# Patient Record
Sex: Female | Born: 1965
Health system: Southern US, Community
[De-identification: ages and names within clinical notes are randomized; demographics above are authoritative.]

## PROBLEM LIST (undated history)

## (undated) DIAGNOSIS — R3915 Urgency of urination: Secondary | ICD-10-CM

## (undated) DIAGNOSIS — R350 Frequency of micturition: Secondary | ICD-10-CM

## (undated) DIAGNOSIS — R3129 Other microscopic hematuria: Secondary | ICD-10-CM

## (undated) DIAGNOSIS — Z973 Presence of spectacles and contact lenses: Secondary | ICD-10-CM

## (undated) DIAGNOSIS — R3989 Other symptoms and signs involving the genitourinary system: Secondary | ICD-10-CM

## (undated) DIAGNOSIS — K5909 Other constipation: Secondary | ICD-10-CM

## (undated) DIAGNOSIS — R351 Nocturia: Secondary | ICD-10-CM

---

## 1991-11-24 HISTORY — PX: NASAL SEPTUM SURGERY: SHX37

## 2001-03-29 ENCOUNTER — Other Ambulatory Visit: Admission: RE | Admit: 2001-03-29 | Discharge: 2001-03-29 | Payer: Self-pay | Admitting: Gynecology

## 2002-06-21 ENCOUNTER — Encounter: Admission: RE | Admit: 2002-06-21 | Discharge: 2002-06-21 | Payer: Self-pay | Admitting: Gynecology

## 2002-06-21 ENCOUNTER — Encounter: Payer: Self-pay | Admitting: Gynecology

## 2002-06-22 ENCOUNTER — Other Ambulatory Visit: Admission: RE | Admit: 2002-06-22 | Discharge: 2002-06-22 | Payer: Self-pay | Admitting: Gynecology

## 2003-07-04 ENCOUNTER — Other Ambulatory Visit: Admission: RE | Admit: 2003-07-04 | Discharge: 2003-07-04 | Payer: Self-pay | Admitting: Gynecology

## 2003-08-12 ENCOUNTER — Emergency Department (HOSPITAL_COMMUNITY): Admission: EM | Admit: 2003-08-12 | Discharge: 2003-08-12 | Payer: Self-pay

## 2004-08-25 ENCOUNTER — Other Ambulatory Visit: Admission: RE | Admit: 2004-08-25 | Discharge: 2004-08-25 | Payer: Self-pay | Admitting: Gynecology

## 2005-09-02 ENCOUNTER — Other Ambulatory Visit: Admission: RE | Admit: 2005-09-02 | Discharge: 2005-09-02 | Payer: Self-pay | Admitting: Gynecology

## 2006-03-11 ENCOUNTER — Other Ambulatory Visit: Admission: RE | Admit: 2006-03-11 | Discharge: 2006-03-11 | Payer: Self-pay | Admitting: Gynecology

## 2006-09-28 ENCOUNTER — Other Ambulatory Visit: Admission: RE | Admit: 2006-09-28 | Discharge: 2006-09-28 | Payer: Self-pay | Admitting: Gynecology

## 2007-11-14 ENCOUNTER — Other Ambulatory Visit: Admission: RE | Admit: 2007-11-14 | Discharge: 2007-11-14 | Payer: Self-pay | Admitting: Gynecology

## 2007-12-07 ENCOUNTER — Encounter: Admission: RE | Admit: 2007-12-07 | Discharge: 2007-12-07 | Payer: Self-pay | Admitting: Gynecology

## 2007-12-12 ENCOUNTER — Encounter: Admission: RE | Admit: 2007-12-12 | Discharge: 2007-12-12 | Payer: Self-pay | Admitting: Gynecology

## 2010-12-14 ENCOUNTER — Encounter: Payer: Self-pay | Admitting: Gynecology

## 2013-05-12 ENCOUNTER — Other Ambulatory Visit: Payer: Self-pay

## 2013-05-12 DIAGNOSIS — Z1231 Encounter for screening mammogram for malignant neoplasm of breast: Secondary | ICD-10-CM

## 2013-06-01 ENCOUNTER — Ambulatory Visit
Admission: RE | Admit: 2013-06-01 | Discharge: 2013-06-01 | Disposition: A | Discharge status: Home / Self Care | Payer: BC Managed Care – PPO | Source: Ambulatory Visit

## 2013-06-01 DIAGNOSIS — Z1231 Encounter for screening mammogram for malignant neoplasm of breast: Secondary | ICD-10-CM

## 2015-03-24 HISTORY — PX: COLONOSCOPY WITH PROPOFOL: SHX5780

## 2015-08-27 ENCOUNTER — Other Ambulatory Visit: Payer: Self-pay | Admitting: Urology

## 2015-09-03 ENCOUNTER — Encounter (HOSPITAL_BASED_OUTPATIENT_CLINIC_OR_DEPARTMENT_OTHER): Payer: Self-pay | Admitting: *Deleted

## 2015-09-04 ENCOUNTER — Encounter (HOSPITAL_BASED_OUTPATIENT_CLINIC_OR_DEPARTMENT_OTHER): Payer: Self-pay | Admitting: *Deleted

## 2015-09-04 NOTE — Progress Notes (Signed)
NPO AFTER MN.  ARRIVE AT 0730.  NEEDS HG AND URINE PREG.  

## 2015-09-09 NOTE — H&P (Signed)
Active Problems Problems  1. Bladder pain (R39.89) 2. Increased urinary frequency (R35.0) 3. Urinary urgency (R39.15)  History of Present Illness Barbara BattenKim returns today in f/u from [facility]. She has a small capacity painful bladder with instability and UUI but she reports she has no incontinence but it is probably because she voids so often. She has had no gross hematuria but has some symptoms suggestive of a UTI today including a bad taste in her mouth that she gets when she has one.   Past Medical History Problems  1. History of renal calculi (Z87.442) 2. History of No acute medical problems  Surgical History Problems  1. History of Cesarean Section  Current Meds 1. Linzess CAPS;  Therapy: (Recorded:23May2016) to Recorded  Allergies Medication  1. No Known Drug Allergies  Family History Problems  1. No pertinent family history : Mother  Social History Problems  1. Alcohol use (Z78.9)   socially 2. Caffeine use (F15.90)   2 cups per day 3. Death in the family, father   age 51 Heart attack 4. Divorced 5. Never a smoker 6. Number of children   1 son 7. Occupation   Engineer, civil (consulting)eal estate Developer  Review of Systems  Genitourinary: urinary frequency, nocturia and suprapubic pain, but no incontinence.  Constitutional: (she feels feverish).  Musculoskeletal: back pain.    Vitals Vital Signs [Data Includes: Last 1 Day]  Recorded: 03Oct2016 03:13PM  Blood Pressure: 116 / 79 Temperature: 98.7 F Heart Rate: 87  Physical Exam Constitutional: Well nourished and well developed . No acute distress.  Pulmonary: No respiratory distress and normal respiratory rhythm and effort.  Cardiovascular: Heart rate and rhythm are normal . No peripheral edema.    Procedure  Procedure: Urodynamics  Test indication: Frequency, Urgency, Rare Incontinence.  The procedure's risks, benefits and infection risk were discussed with the patient.  Pre Uroflow & Catheterization  Procedure: Pre  Uroflow Study. Equipment And Procedure:  The patient did not void.  A(n) 14 French red rubber catheter was inserted.  After catheter placement, measurements were obtained the volume collected was 40 ml.  A urodynamic catheter was inserted. Prior to starting the study, she said she felt discomfort in her bladder and rating it a 2-3/10. She said she keeps a discomfort and soreness there most of the time.  Cystometry  Procedure: Cystometrogram.  The bladder was filled with room temperature water at a rate of less than 50 cc per minute.  After bladder filling, measurements obtained include the maximum cystometric capacity of 283ml.  Maximum capacity produced a feeling of bladder fullness and pain.  Bladder sensations. The first sensation of filling occurred at 227ml. The normal desire to void occurred at 248ml. The strong desire to void occurred at 272ml. The first contraction occurred at 283ml, the maximum unstable bladder contraction pressure was 33 cm H2O and severe urine leakage occurred. Bladder pain reached a 4/10 at max capacity. Injection of contrast was performed for the cystometrogram.  Leak Point Pressure  Procedure: Valsalva Leak Point Pressure.  The patient was placed in the sitting position.  A fluoroscopic method was used to determine leak point pressure.  The patient did not leak and the maximum abdominal pressure generated was 81 cm H2O.  Pressure - Flow Study  Procedure: Pressure-Flow Study.  Findings: voluntary contraction generated, the volume voided was 143 ml, the maximum flow rate was 14 ml/s, the detrusor pressure at maximum flow was 7 cmH20, the maximum detrusor pressure was 15 cm H2O and the postvoid  residual was approx. 20 mls per pvr photo ml.  Electromyogram  Procedure: Electromyogram. Equipment And Procedure:  Electromyogram activity was measured by surface electrodes.  Electromyogram activity increased during unstable bladder contractions, increased during voiding  and increased due to poor relaxation of the external sphincter.  Fluoroscopy and VCUG  Procedure: Fluoroscopy and VCUG.  During VCUG/Cystogram, the precontrast film showed no bony abnormality.  During bladder filling, the contour was trabeculated. Findings included no reflux. Bladder descended approx. 1cm during cough and valsalva maneuvers. There were no complications.  Post procedure patient given one tablet po. Ciprofloxacin 500 mg.  Nurse Impression: Barbara [name] held a max capacity of approx. 283 mls. Prior to starting the study, she felt discomfort in her bladder and rated it a 2-3/10. She said she always has discomfort but that it becomes worse with a full bladder. No SUI was noted. She did not express her 1st sensation until reaching 227 mls. Her normal and strong desires came shortly afterwards. There was positive instability at 283 mls with leakage. She did say she does not normally leak urine because she goes to the [place] so frequently. She had multiple unstable contractions during the study. Her bladder pain reached a 4/10 at max capacity. Filling was continued, and she was able to generate a voluntary contraction and void. There was increased EMG activity during both voluntary and involuntary voiding. She emptied efficiently leaving a final residual of approx. 20 mls per pvr photo. Her pain went down to a 1 after voiding. Trabeculation was noted. no reflux was seen.  Procedure was supervised by Dr. Annabell Howells    Assessment She has a small,painful, unstable bladder with IC type symptoms.  She had microhematuria on her cath UA prior to the procedure.  She has symptoms suggestive of a UTI today.   She didn't improve significantly with Myrbetriq.   Plan Bladder pain  1. Follow-up Schedule Surgery Office  Follow-up  Status: Hold For - Appointment   Requested for: 03Oct2016 2. URINE CULTURE; Status:Hold For - Specimen/Data Collection,Appointment; Requested  for:03Oct2016;  Health Maintenance   3. UA With REFLEX; [Do Not Release]; Status:In Progress - Specimen/Data Collected;    Done: 03Oct2016  Urine culture today.  I will give her Cipro  po bid #4 pending the culture.  I discussed the options and at this time I am going to get her set up for cystoscopy with HOD and instillation of P&M.  I reviewed the risks of bleeding, infection, bladder injury, retention, thrombotic events and anesthetic complications.  I informed her that about 60% of patients have relieft of the symptoms with this procedure.

## 2015-09-10 ENCOUNTER — Ambulatory Visit (HOSPITAL_BASED_OUTPATIENT_CLINIC_OR_DEPARTMENT_OTHER): Payer: 59 | Admitting: Certified Registered"

## 2015-09-10 ENCOUNTER — Encounter (HOSPITAL_BASED_OUTPATIENT_CLINIC_OR_DEPARTMENT_OTHER): Payer: Self-pay | Admitting: Certified Registered"

## 2015-09-10 ENCOUNTER — Encounter (HOSPITAL_BASED_OUTPATIENT_CLINIC_OR_DEPARTMENT_OTHER)
Admission: RE | Disposition: A | Discharge status: Home / Self Care | Payer: Self-pay | Source: Ambulatory Visit | Attending: Urology

## 2015-09-10 ENCOUNTER — Ambulatory Visit (HOSPITAL_BASED_OUTPATIENT_CLINIC_OR_DEPARTMENT_OTHER)
Admission: RE | Admit: 2015-09-10 | Discharge: 2015-09-10 | Disposition: A | Discharge status: Home / Self Care | Payer: 59 | Source: Ambulatory Visit | Attending: Urology | Admitting: Urology

## 2015-09-10 DIAGNOSIS — R3989 Other symptoms and signs involving the genitourinary system: Secondary | ICD-10-CM | POA: Diagnosis not present

## 2015-09-10 DIAGNOSIS — Z87442 Personal history of urinary calculi: Secondary | ICD-10-CM | POA: Diagnosis not present

## 2015-09-10 DIAGNOSIS — R3129 Other microscopic hematuria: Secondary | ICD-10-CM | POA: Diagnosis not present

## 2015-09-10 DIAGNOSIS — R3915 Urgency of urination: Secondary | ICD-10-CM | POA: Insufficient documentation

## 2015-09-10 DIAGNOSIS — R350 Frequency of micturition: Secondary | ICD-10-CM | POA: Diagnosis not present

## 2015-09-10 HISTORY — DX: Nocturia: R35.1

## 2015-09-10 HISTORY — DX: Other microscopic hematuria: R31.29

## 2015-09-10 HISTORY — DX: Presence of spectacles and contact lenses: Z97.3

## 2015-09-10 HISTORY — DX: Urgency of urination: R39.15

## 2015-09-10 HISTORY — PX: CYSTO WITH HYDRODISTENSION: SHX5453

## 2015-09-10 HISTORY — DX: Other symptoms and signs involving the genitourinary system: R39.89

## 2015-09-10 HISTORY — DX: Other constipation: K59.09

## 2015-09-10 HISTORY — DX: Frequency of micturition: R35.0

## 2015-09-10 LAB — POCT HEMOGLOBIN-HEMACUE: HEMOGLOBIN: 12.1 g/dL (ref 12.0–15.0)

## 2015-09-10 LAB — POCT PREGNANCY, URINE: Preg Test, Ur: NEGATIVE

## 2015-09-10 SURGERY — CYSTOSCOPY, WITH BLADDER HYDRODISTENSION
Anesthesia: General

## 2015-09-10 MED ORDER — FENTANYL CITRATE (PF) 100 MCG/2ML IJ SOLN
INTRAMUSCULAR | Status: DC | PRN
  Administered 2015-09-10: 50 ug via INTRAVENOUS
  Administered 2015-09-10: 25 ug via INTRAVENOUS

## 2015-09-10 MED ORDER — DEXAMETHASONE SODIUM PHOSPHATE 4 MG/ML IJ SOLN
INTRAMUSCULAR | Status: DC | PRN
  Administered 2015-09-10: 10 mg via INTRAVENOUS

## 2015-09-10 MED ORDER — STERILE WATER FOR IRRIGATION IR SOLN
Status: DC | PRN
  Administered 2015-09-10: 3000 mL via INTRAVESICAL

## 2015-09-10 MED ORDER — LACTATED RINGERS IV SOLN
INTRAVENOUS | Status: DC
  Administered 2015-09-10: 09:00:00 via INTRAVENOUS
  Filled 2015-09-10: qty 1000

## 2015-09-10 MED ORDER — MIDAZOLAM HCL 5 MG/5ML IJ SOLN
INTRAMUSCULAR | Status: DC | PRN
  Administered 2015-09-10: 2 mg via INTRAVENOUS

## 2015-09-10 MED ORDER — CIPROFLOXACIN IN D5W 400 MG/200ML IV SOLN
400.0000 mg | INTRAVENOUS | Status: AC
  Administered 2015-09-10: 400 mg via INTRAVENOUS
  Filled 2015-09-10: qty 200

## 2015-09-10 MED ORDER — FENTANYL CITRATE (PF) 100 MCG/2ML IJ SOLN
INTRAMUSCULAR | Status: AC
Start: 2015-09-10 — End: 2015-09-10
  Filled 2015-09-10: qty 4

## 2015-09-10 MED ORDER — PHENAZOPYRIDINE HCL 200 MG PO TABS
ORAL_TABLET | ORAL | Status: DC | PRN
  Administered 2015-09-10: 30 mL via INTRAVESICAL

## 2015-09-10 MED ORDER — BUPIVACAINE HCL 0.5 % IJ SOLN: INTRAMUSCULAR | Status: DC | PRN

## 2015-09-10 MED ORDER — ONDANSETRON HCL 4 MG/2ML IJ SOLN
INTRAMUSCULAR | Status: DC | PRN
  Administered 2015-09-10: 4 mg via INTRAVENOUS

## 2015-09-10 MED ORDER — LIDOCAINE HCL (CARDIAC) 20 MG/ML IV SOLN
INTRAVENOUS | Status: DC | PRN
  Administered 2015-09-10: 80 mg via INTRAVENOUS

## 2015-09-10 MED ORDER — PROPOFOL 10 MG/ML IV BOLUS
INTRAVENOUS | Status: DC | PRN
  Administered 2015-09-10: 200 mg via INTRAVENOUS

## 2015-09-10 MED ORDER — PHENAZOPYRIDINE HCL 200 MG PO TABS: 200.0000 mg | ORAL_TABLET | Freq: Three times a day (TID) | ORAL | Status: AC | PRN

## 2015-09-10 MED ORDER — MIDAZOLAM HCL 2 MG/2ML IJ SOLN
INTRAMUSCULAR | Status: AC
  Filled 2015-09-10: qty 2

## 2015-09-10 MED ORDER — HYDROCODONE-ACETAMINOPHEN 5-325 MG PO TABS: 1.0000 | ORAL_TABLET | Freq: Four times a day (QID) | ORAL | Status: DC | PRN

## 2015-09-10 MED ORDER — CIPROFLOXACIN IN D5W 400 MG/200ML IV SOLN
INTRAVENOUS | Status: AC
  Filled 2015-09-10: qty 200

## 2015-09-10 SURGICAL SUPPLY — 17 items
BAG DRAIN URO-CYSTO SKYTR STRL (DRAIN) ×2 IMPLANT
BAG DRN UROCATH (DRAIN) ×1
CATH ROBINSON RED A/P 16FR (CATHETERS) ×2 IMPLANT
CLOTH BEACON ORANGE TIMEOUT ST (SAFETY) ×2 IMPLANT
ELECT REM PT RETURN 9FT ADLT (ELECTROSURGICAL)
ELECTRODE REM PT RTRN 9FT ADLT (ELECTROSURGICAL) ×1 IMPLANT
GLOVE SURG SS PI 8.0 STRL IVOR (GLOVE) ×2 IMPLANT
GOWN STRL REUS W/ TWL XL LVL3 (GOWN DISPOSABLE) ×1 IMPLANT
GOWN STRL REUS W/TWL XL LVL3 (GOWN DISPOSABLE) ×2
KIT ROOM TURNOVER WOR (KITS) ×3 IMPLANT
MANIFOLD NEPTUNE II (INSTRUMENTS) ×1 IMPLANT
NDL SAFETY ECLIPSE 18X1.5 (NEEDLE) ×1 IMPLANT
NEEDLE HYPO 18GX1.5 SHARP (NEEDLE) ×2
NS IRRIG 500ML POUR BTL (IV SOLUTION) IMPLANT
PACK CYSTO (CUSTOM PROCEDURE TRAY) ×2 IMPLANT
SYR 30ML LL (SYRINGE) ×2 IMPLANT
WATER STERILE IRR 3000ML UROMA (IV SOLUTION) ×2 IMPLANT

## 2015-09-10 NOTE — Discharge Instructions (Signed)
CYSTOSCOPY HOME CARE INSTRUCTIONS ° °Activity: °Rest for the remainder of the day.  Do not drive or operate equipment today.  You may resume normal activities in one to two days as instructed by your physician.  ° °Meals: °Drink plenty of liquids and eat light foods such as gelatin or soup this evening.  You may return to a normal meal plan tomorrow. ° °Return to Work: °You may return to work in one to two days or as instructed by your physician. ° °Special Instructions / Symptoms: °Call your physician if any of these symptoms occur: ° ° -persistent or heavy bleeding ° -bleeding which continues after first few urination ° -large blood clots that are difficult to pass ° -urine stream diminishes or stops completely ° -fever equal to or higher than 101 degrees Farenheit. ° -cloudy urine with a strong, foul odor ° -severe pain ° °Females should always wipe from front to back after elimination.  You may feel some burning pain when you urinate.  This should disappear with time.  Applying moist heat to the lower abdomen or a hot tub bath may help relieve the pain. \ ° ° ° ° °Post Anesthesia Home Care Instructions ° °Activity: °Get plenty of rest for the remainder of the day. A responsible adult should stay with you for 24 hours following the procedure.  °For the next 24 hours, DO NOT: °-Drive a car °-Operate machinery °-Drink alcoholic beverages °-Take any medication unless instructed by your physician °-Make any legal decisions or sign important papers. ° °Meals: °Start with liquid foods such as gelatin or soup. Progress to regular foods as tolerated. Avoid greasy, spicy, heavy foods. If nausea and/or vomiting occur, drink only clear liquids until the nausea and/or vomiting subsides. Call your physician if vomiting continues. ° °Special Instructions/Symptoms: °Your throat may feel dry or sore from the anesthesia or the breathing tube placed in your throat during surgery. If this causes discomfort, gargle with warm salt  water. The discomfort should disappear within 24 hours. ° °If you had a scopolamine patch placed behind your ear for the management of post- operative nausea and/or vomiting: ° °1. The medication in the patch is effective for 72 hours, after which it should be removed.  Wrap patch in a tissue and discard in the trash. Wash hands thoroughly with soap and water. °2. You may remove the patch earlier than 72 hours if you experience unpleasant side effects which may include dry mouth, dizziness or visual disturbances. °3. Avoid touching the patch. Wash your hands with soap and water after contact with the patch. °  ° °

## 2015-09-10 NOTE — Brief Op Note (Signed)
09/10/2015  9:21 AM  PATIENT:  Gerhard MunchKimberly B Garceau  49 y.o. female  PRE-OPERATIVE DIAGNOSIS:  PAINFUL BLADDER  POST-OPERATIVE DIAGNOSIS:  painful bladder/IC  PROCEDURE:  Procedure(s): CYSTOSCOPY/HYDRODISTENSION AND INSTILLATION OF MARCAINE AND PYRIIDIUM (N/A)  SURGEON:  Surgeon(s) and Role:    * Bjorn PippinJohn Ocie Tino, MD - Primary  PHYSICIAN ASSISTANT:   ASSISTANTS: none   ANESTHESIA:   general  EBL:  Total I/O In: 200 [I.V.:200] Out: -   BLOOD ADMINISTERED:none  DRAINS: none   LOCAL MEDICATIONS USED:  MARCAINE    and Amount: 30 ml  SPECIMEN:  No Specimen  DISPOSITION OF SPECIMEN:  N/A  COUNTS:  YES  TOURNIQUET:  * No tourniquets in log *  DICTATION: .Other Dictation: Dictation Number [id number]  PLAN OF CARE: Discharge to home after PACU  PATIENT DISPOSITION:  PACU - hemodynamically stable.   Delay start of Pharmacological VTE agent (>24hrs) due to surgical blood loss or risk of bleeding: not applicable

## 2015-09-10 NOTE — Anesthesia Procedure Notes (Signed)
Procedure Name: LMA Insertion Date/Time: 09/10/2015 9:05 AM Performed by: Renella CunasHAZEL, Zylon Creamer D Pre-anesthesia Checklist: Patient identified, Emergency Drugs available, Suction available and Patient being monitored Patient Re-evaluated:Patient Re-evaluated prior to inductionOxygen Delivery Method: Circle System Utilized Preoxygenation: Pre-oxygenation with 100% oxygen Intubation Type: IV induction Ventilation: Mask ventilation without difficulty LMA: LMA inserted LMA Size: 4.0 Number of attempts: 1 Airway Equipment and Method: Bite block Placement Confirmation: positive ETCO2 Tube secured with: Tape Dental Injury: Teeth and Oropharynx as per pre-operative assessment

## 2015-09-10 NOTE — Anesthesia Postprocedure Evaluation (Signed)
  Anesthesia Post-op Note  Patient: Barbara [name]  Procedure(s) Performed: Procedure(s): CYSTOSCOPY/HYDRODISTENSION AND INSTILLATION OF MARCAINE AND PYRIIDIUM (N/A)  Patient Location: PACU  Anesthesia Type:General  Level of Consciousness: awake  Airway and Oxygen Therapy: Patient Spontanous Breathing  Post-op Pain: mild  Post-op Assessment: Post-op Vital signs reviewed              Post-op Vital Signs: Reviewed  Last Vitals:  Filed Vitals:   09/10/15 1000  BP: 128/74  Pulse: 80  Temp:   Resp: 13    Complications: No apparent anesthesia complications

## 2015-09-10 NOTE — Transfer of Care (Signed)
Immediate Anesthesia Transfer of Care Note  Patient: Barbara [name]  Procedure(s) Performed: Procedure(s) (LRB): CYSTOSCOPY/HYDRODISTENSION AND INSTILLATION OF MARCAINE AND PYRIIDIUM (N/A)  Patient Location: PACU  Anesthesia Type: General  Level of Consciousness: awake, oriented, sedated and patient cooperative  Airway & Oxygen Therapy: Patient Spontanous Breathing and Patient connected to face mask oxygen  Post-op Assessment: Report given to PACU RN and Post -op Vital signs reviewed and stable  Post vital signs: Reviewed and stable  Complications: No apparent anesthesia complications

## 2015-09-10 NOTE — Interval H&P Note (Signed)
History and Physical Interval Note:  09/10/2015 8:26 AM  Barbara MunchKimberly B Wyffels  has presented today for surgery, with the diagnosis of PAINFUL BLADDER  The various methods of treatment have been discussed with the patient and family. After consideration of risks, benefits and other options for treatment, the patient has consented to  Procedure(s): CYSTOSCOPY/HYDRODISTENSION AND INSTILLATION OF MARCAINE AND PYRIIDIUM (N/A) as a surgical intervention .  The patient's history has been reviewed, patient examined, no change in status, stable for surgery.  I have reviewed the patient's chart and labs.  Questions were answered to the patient's satisfaction.     [name]

## 2015-09-10 NOTE — Anesthesia Preprocedure Evaluation (Signed)
Anesthesia Evaluation  Patient identified by MRN, date of birth, ID band Patient awake  General Assessment Comment:History noted. CE  Airway Mallampati: II  TM Distance: >3 FB Neck ROM: Full    Dental   Pulmonary neg pulmonary ROS,    breath sounds clear to auscultation       Cardiovascular negative cardio ROS   Rhythm:Regular Rate:Normal     Neuro/Psych    GI/Hepatic negative GI ROS, Neg liver ROS,   Endo/Other  negative endocrine ROS  Renal/GU negative Renal ROS     Musculoskeletal   Abdominal   Peds  Hematology   Anesthesia Other Findings   Reproductive/Obstetrics                             Anesthesia Physical Anesthesia Plan  ASA: II  Anesthesia Plan: General   Post-op Pain Management:    Induction: Intravenous  Airway Management Planned: LMA  Additional Equipment:   Intra-op Plan:   Post-operative Plan: Extubation in OR  Informed Consent: I have reviewed the patients History and Physical, chart, labs and discussed the procedure including the risks, benefits and alternatives for the proposed anesthesia with the patient or authorized representative who has indicated his/her understanding and acceptance.   Dental advisory given  Plan Discussed with: CRNA, Anesthesiologist and Surgeon  Anesthesia Plan Comments:         Anesthesia Quick Evaluation

## 2015-09-11 ENCOUNTER — Encounter (HOSPITAL_BASED_OUTPATIENT_CLINIC_OR_DEPARTMENT_OTHER): Payer: Self-pay | Admitting: Urology

## 2015-09-11 NOTE — Op Note (Signed)
NAMRaleigh [name]:  [name], Barbara         ACCOUNT NO.:  [id number]  MEDICAL RECORD NO.:  [id number]  LOCATION:                               [facility]  PHYSICIAN:  Barbara [name], M.D.    DATE OF BIRTH:  Apr 07, 1966  DATE OF PROCEDURE:  09/10/2015 DATE OF DISCHARGE:  09/10/2015                              OPERATIVE REPORT   PROCEDURE:  Cystoscopy with hydrodistention of the bladder and instillation of Pyridium and Marcaine.  PREOPERATIVE DIAGNOSES:  Painful bladder, probable interstitial cystitis.  POSTOPERATIVE DIAGNOSES:  Painful bladder, probable interstitial cystitis.  SURGEON:  Barbara [name], M.D.  ANESTHESIA:  General.  SPECIMENS:  None.  DRAINS:  None.  COMPLICATIONS:  None.  INDICATIONS:  Barbara BradfordKimberly is a 49 year old white female with a history of bladder pain with frequency and urgency.  She was found on urodynamics to have a small capacity bladder of approximately 280 mL with instability and incontinence, but she does not leak as a rule.  She did have pain with filling.  After reviewing the options for treatment, further evaluation, she elected hydrodistention of the bladder.  FINDINGS AND PROCEDURE:  She was given Cipro.  She was taken to the operating room where general anesthetic was induced.  She was placed in lithotomy position.  Her perineum and genitalia were prepped with Betadine solution and she was draped in the usual sterile fashion.  Cystoscopy was performed using the 23-French scope and 30-degree lens. Examination revealed a normal urethra.  The bladder wall had mild trabeculation.  No tumors, stones, or inflammation were noted.  There was a very wide mouth diverticular-type depression on the left lateral wall lateral to the ureteral orifice.  The ureteral orifices were otherwise in their normal anatomic position effluxing clear urine.  Once initial cystoscopy was performed, the bladder was filled to capacity 80 cm of water pressure to maintain  visualization of the possible diverticular area where there appeared to be deficient muscular backing lateral to the left ureteral orifice.  No mucosal injury occurred.  After filling to capacity, the bladder was drained.  Her capacity under anesthesia was approximately 950 mL.  The terminal efflux was clear.  Inspection of the bladder after hydrodistention revealed scattered glomerulations in the trigone and on the posterior wall suggestive of interstitial cystitis.  No mucosal tears or sore Hunner ulcers were identified.  At this point, the bladder was drained.  A red rubber catheter was placed and the bladder was instilled with 30 mL of 0.25% Marcaine with 400 mg of crushed Pyridium.  The catheter was then removed.  The patient was taken down from lithotomy position.  Her anesthetic was reversed. She was moved to the recovery room in a stable condition.  There were no complications.     Barbara [name], M.D.     JJW/MEDQ  D:  09/10/2015  T:  09/10/2015  Job:  [id number]

## 2015-10-08 ENCOUNTER — Other Ambulatory Visit: Payer: Self-pay

## 2015-10-08 DIAGNOSIS — Z1231 Encounter for screening mammogram for malignant neoplasm of breast: Secondary | ICD-10-CM

## 2015-10-30 ENCOUNTER — Ambulatory Visit
Admission: RE | Admit: 2015-10-30 | Discharge: 2015-10-30 | Disposition: A | Discharge status: Home / Self Care | Payer: 59 | Source: Ambulatory Visit

## 2015-10-30 DIAGNOSIS — Z1231 Encounter for screening mammogram for malignant neoplasm of breast: Secondary | ICD-10-CM

## 2016-05-20 IMAGING — MG MM SCREENING W/IMPLANTS
8 series · 8 of 8 positions shown · non-contrast
Comparison: Previous exam(s).

CLINICAL DATA: Screening.

EXAM:
DIGITAL SCREENING BILATERAL MAMMOGRAM WITH IMPLANTS AND CAD
The patient has retropectoral implants. Standard and implant
displaced views were performed.

[R CC]
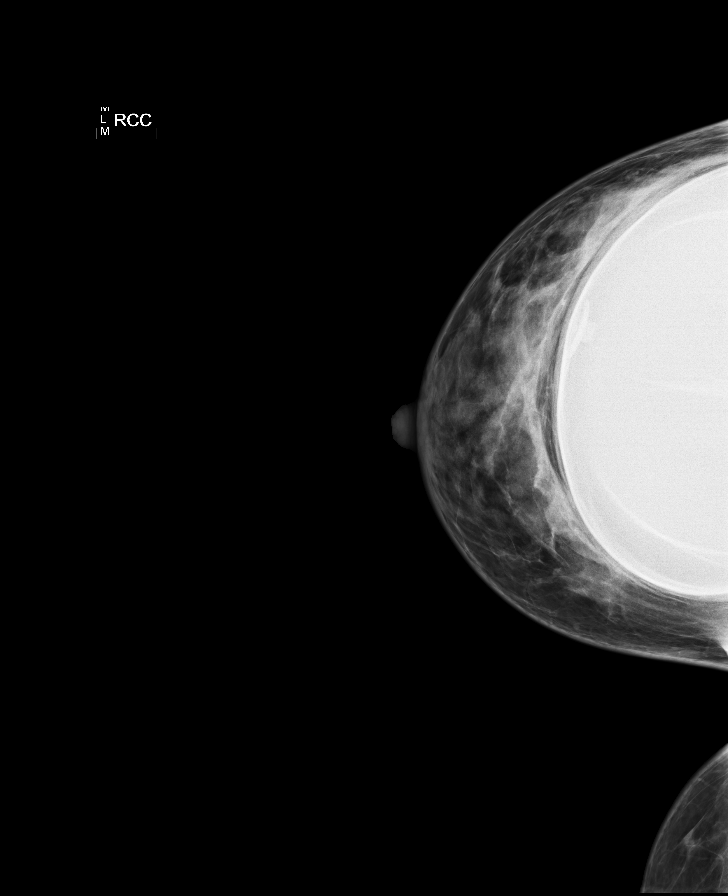

[L CC]
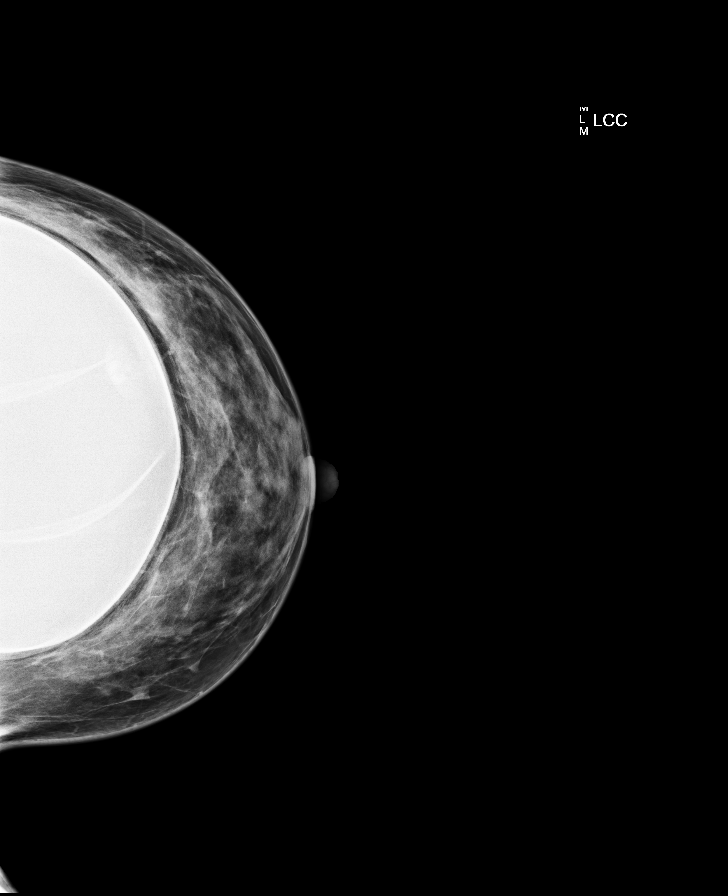

[L MLO]
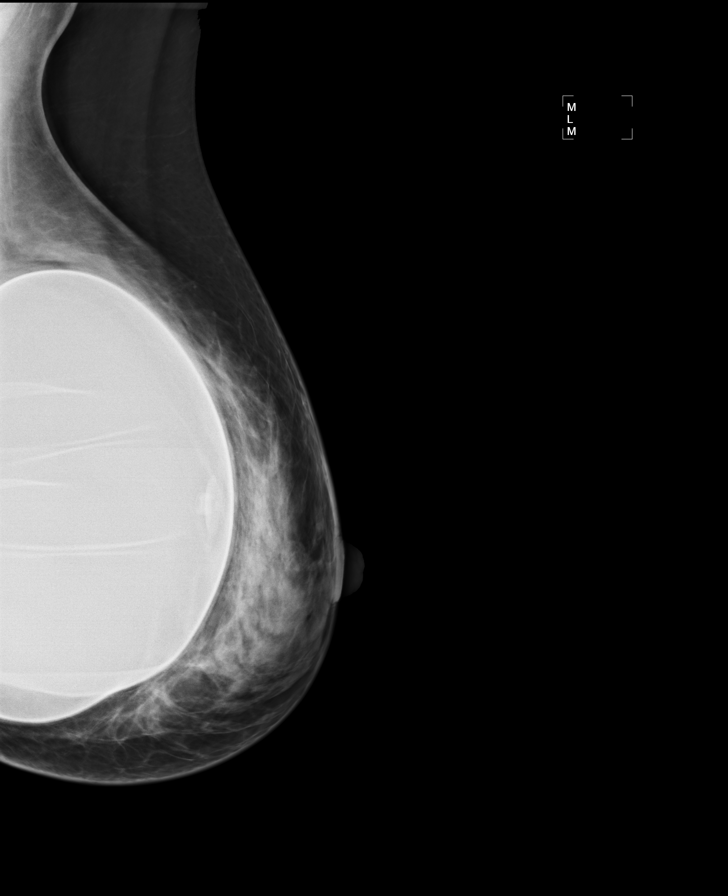

[R MLO]
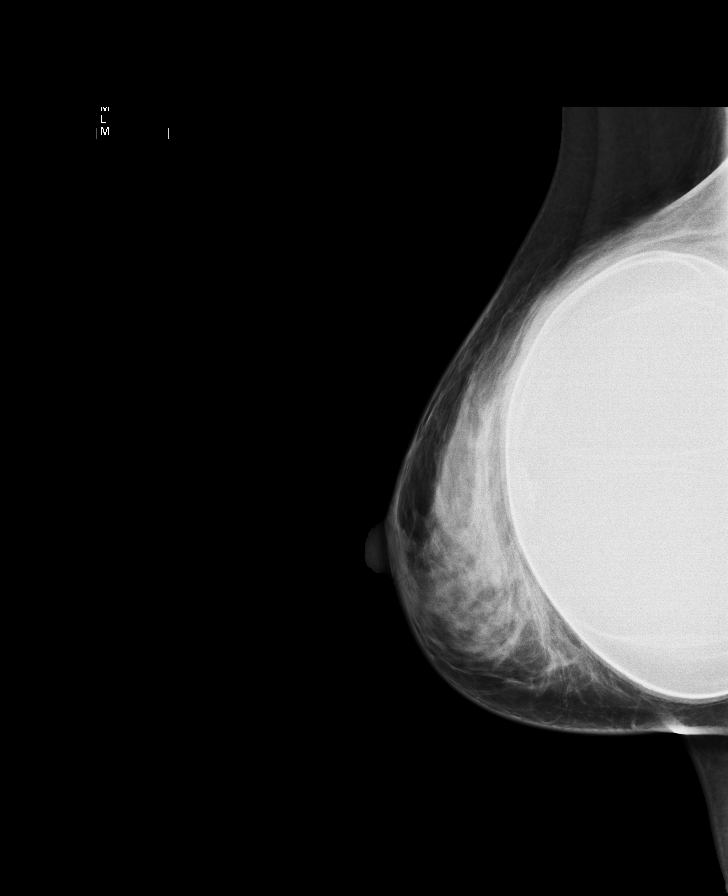

[R CCID]
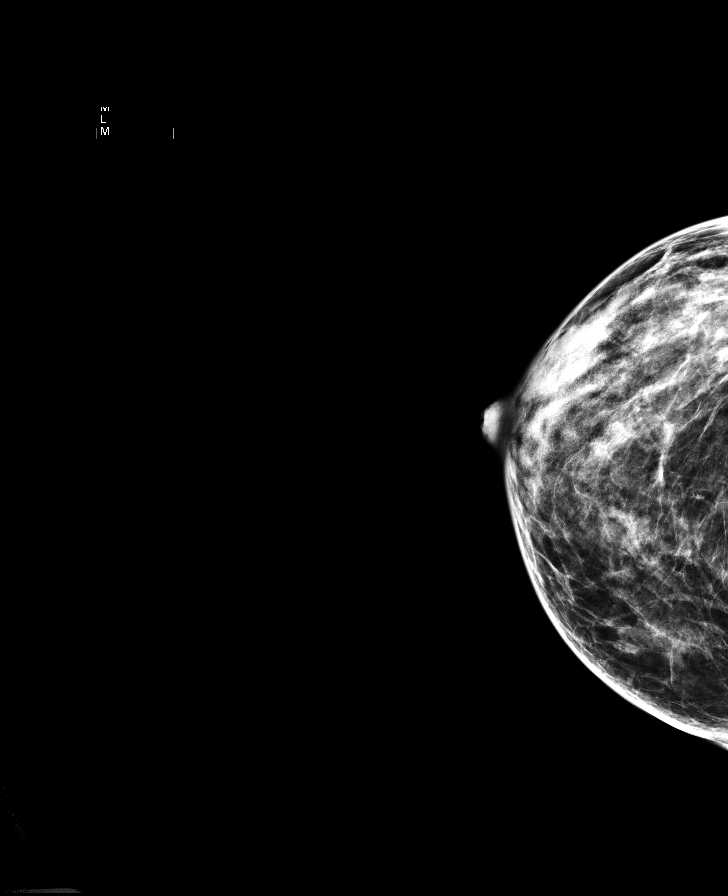

[L CCID]
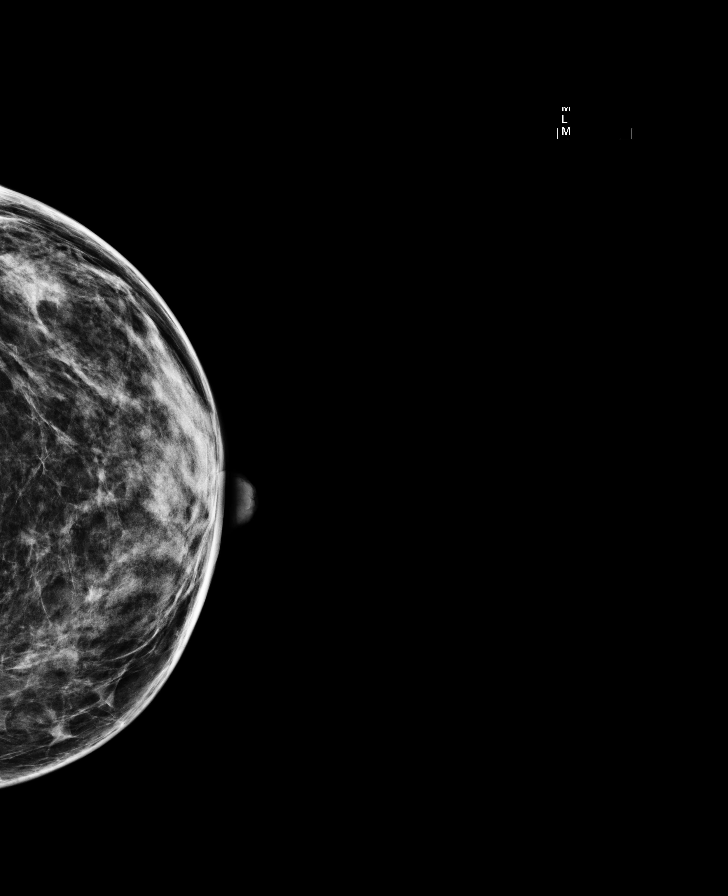

[L MLOID]
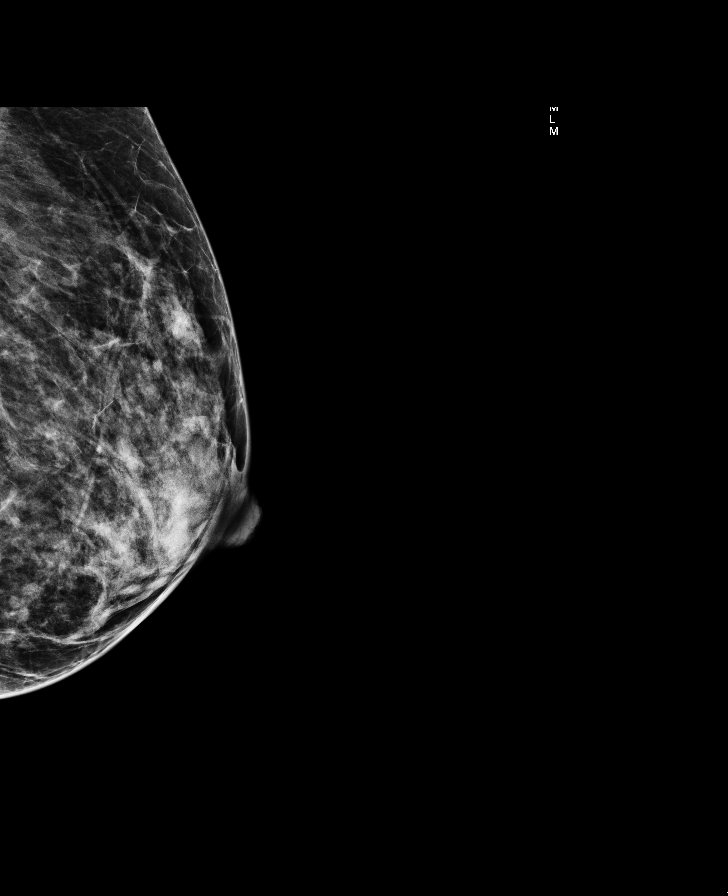

[R MLOID]
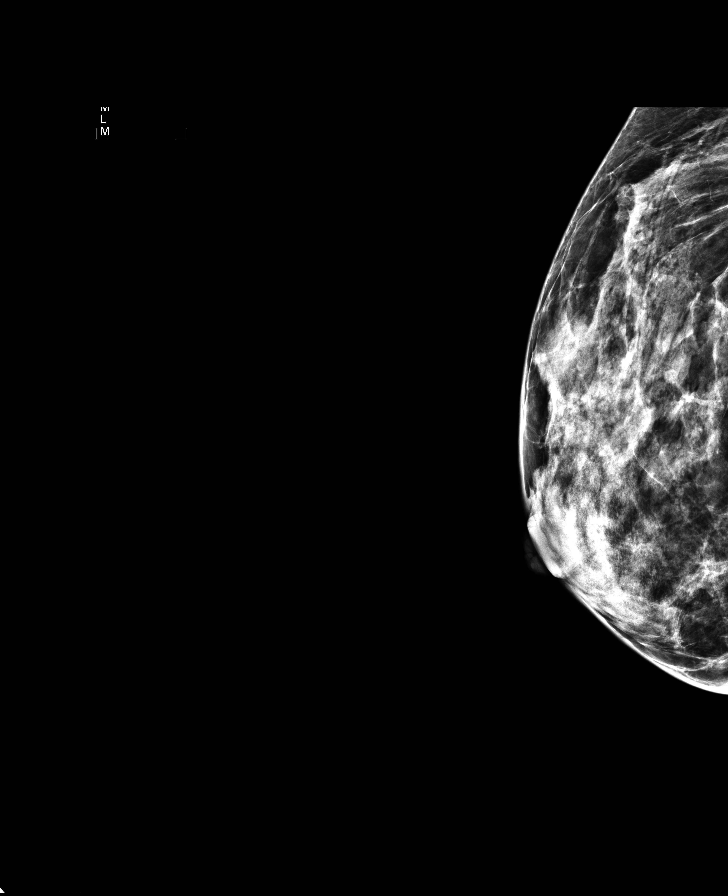

[8 of 8 positions shown; findings below may reference images not displayed]

ACR Breast Density Category c: The breast tissue is heterogeneously
dense, which may obscure small masses.
FINDINGS: There are no findings suspicious for malignancy. Images were
processed with CAD.
IMPRESSION: No mammographic evidence of malignancy. A result letter of this
screening mammogram will be mailed directly to the patient.

RECOMMENDATION:
Screening mammogram in one year. (Code:R9-8-RFM)

BI-RADS CATEGORY  1:  Negative.

## 2018-10-27 ENCOUNTER — Other Ambulatory Visit: Payer: Self-pay | Admitting: Nurse Practitioner

## 2018-10-27 DIAGNOSIS — R109 Unspecified abdominal pain: Secondary | ICD-10-CM

## 2018-10-28 ENCOUNTER — Ambulatory Visit
Admission: RE | Admit: 2018-10-28 | Discharge: 2018-10-28 | Disposition: A | Discharge status: Home / Self Care | Payer: BLUE CROSS/BLUE SHIELD | Source: Ambulatory Visit | Attending: Nurse Practitioner | Admitting: Nurse Practitioner

## 2018-10-28 DIAGNOSIS — R109 Unspecified abdominal pain: Secondary | ICD-10-CM

## 2019-05-11 ENCOUNTER — Other Ambulatory Visit: Payer: Self-pay | Admitting: Obstetrics & Gynecology

## 2019-05-11 DIAGNOSIS — Z1231 Encounter for screening mammogram for malignant neoplasm of breast: Secondary | ICD-10-CM

## 2022-12-29 DIAGNOSIS — E559 Vitamin D deficiency, unspecified: Secondary | ICD-10-CM | POA: Diagnosis not present

## 2022-12-29 DIAGNOSIS — E038 Other specified hypothyroidism: Secondary | ICD-10-CM | POA: Diagnosis not present

## 2022-12-31 DIAGNOSIS — N854 Malposition of uterus: Secondary | ICD-10-CM | POA: Diagnosis not present

## 2022-12-31 DIAGNOSIS — E038 Other specified hypothyroidism: Secondary | ICD-10-CM | POA: Diagnosis not present

## 2022-12-31 DIAGNOSIS — N3941 Urge incontinence: Secondary | ICD-10-CM | POA: Diagnosis not present

## 2022-12-31 DIAGNOSIS — Z79890 Hormone replacement therapy (postmenopausal): Secondary | ICD-10-CM | POA: Diagnosis not present

## 2022-12-31 DIAGNOSIS — R102 Pelvic and perineal pain: Secondary | ICD-10-CM | POA: Diagnosis not present

## 2022-12-31 DIAGNOSIS — E559 Vitamin D deficiency, unspecified: Secondary | ICD-10-CM | POA: Diagnosis not present

## 2022-12-31 DIAGNOSIS — B3731 Acute candidiasis of vulva and vagina: Secondary | ICD-10-CM | POA: Diagnosis not present

## 2022-12-31 DIAGNOSIS — N950 Postmenopausal bleeding: Secondary | ICD-10-CM | POA: Diagnosis not present

## 2022-12-31 DIAGNOSIS — R1032 Left lower quadrant pain: Secondary | ICD-10-CM | POA: Diagnosis not present

## 2022-12-31 DIAGNOSIS — D250 Submucous leiomyoma of uterus: Secondary | ICD-10-CM | POA: Diagnosis not present

## 2023-02-03 DIAGNOSIS — K5909 Other constipation: Secondary | ICD-10-CM | POA: Diagnosis not present

## 2023-02-03 DIAGNOSIS — N3941 Urge incontinence: Secondary | ICD-10-CM | POA: Diagnosis not present

## 2023-02-03 DIAGNOSIS — Z0000 Encounter for general adult medical examination without abnormal findings: Secondary | ICD-10-CM | POA: Diagnosis not present

## 2023-02-03 DIAGNOSIS — Z23 Encounter for immunization: Secondary | ICD-10-CM | POA: Diagnosis not present

## 2023-02-03 DIAGNOSIS — R1032 Left lower quadrant pain: Secondary | ICD-10-CM | POA: Diagnosis not present

## 2023-03-16 DIAGNOSIS — E039 Hypothyroidism, unspecified: Secondary | ICD-10-CM | POA: Diagnosis not present

## 2023-03-16 DIAGNOSIS — Z79890 Hormone replacement therapy (postmenopausal): Secondary | ICD-10-CM | POA: Diagnosis not present

## 2023-03-16 DIAGNOSIS — N951 Menopausal and female climacteric states: Secondary | ICD-10-CM | POA: Diagnosis not present

## 2023-03-30 DIAGNOSIS — Z1231 Encounter for screening mammogram for malignant neoplasm of breast: Secondary | ICD-10-CM | POA: Diagnosis not present

## 2023-07-05 DIAGNOSIS — E038 Other specified hypothyroidism: Secondary | ICD-10-CM | POA: Diagnosis not present

## 2023-07-05 DIAGNOSIS — E559 Vitamin D deficiency, unspecified: Secondary | ICD-10-CM | POA: Diagnosis not present

## 2023-08-10 DIAGNOSIS — E559 Vitamin D deficiency, unspecified: Secondary | ICD-10-CM | POA: Diagnosis not present

## 2023-08-10 DIAGNOSIS — Z7689 Persons encountering health services in other specified circumstances: Secondary | ICD-10-CM | POA: Diagnosis not present

## 2023-08-10 DIAGNOSIS — E038 Other specified hypothyroidism: Secondary | ICD-10-CM | POA: Diagnosis not present

## 2023-08-31 DIAGNOSIS — D225 Melanocytic nevi of trunk: Secondary | ICD-10-CM | POA: Diagnosis not present

## 2023-08-31 DIAGNOSIS — L821 Other seborrheic keratosis: Secondary | ICD-10-CM | POA: Diagnosis not present

## 2023-08-31 DIAGNOSIS — L700 Acne vulgaris: Secondary | ICD-10-CM | POA: Diagnosis not present

## 2023-09-08 DIAGNOSIS — Z01810 Encounter for preprocedural cardiovascular examination: Secondary | ICD-10-CM | POA: Diagnosis not present

## 2023-09-08 DIAGNOSIS — Z01818 Encounter for other preprocedural examination: Secondary | ICD-10-CM | POA: Diagnosis not present

## 2023-09-08 DIAGNOSIS — E559 Vitamin D deficiency, unspecified: Secondary | ICD-10-CM | POA: Diagnosis not present

## 2023-09-08 DIAGNOSIS — E038 Other specified hypothyroidism: Secondary | ICD-10-CM | POA: Diagnosis not present

## 2023-09-09 DIAGNOSIS — R9431 Abnormal electrocardiogram [ECG] [EKG]: Secondary | ICD-10-CM | POA: Diagnosis not present

## 2023-09-15 DIAGNOSIS — Z01818 Encounter for other preprocedural examination: Secondary | ICD-10-CM | POA: Diagnosis not present

## 2023-09-27 DIAGNOSIS — Z01818 Encounter for other preprocedural examination: Secondary | ICD-10-CM | POA: Diagnosis not present

## 2023-10-20 ENCOUNTER — Emergency Department
Admission: EM | Admit: 2023-10-20 | Discharge: 2023-10-20 | Disposition: A | Discharge status: Home / Self Care | Payer: 59 | Attending: Emergency Medicine | Admitting: Emergency Medicine

## 2023-10-20 ENCOUNTER — Other Ambulatory Visit: Payer: Self-pay

## 2023-10-20 DIAGNOSIS — L299 Pruritus, unspecified: Secondary | ICD-10-CM | POA: Diagnosis not present

## 2023-10-20 DIAGNOSIS — L509 Urticaria, unspecified: Secondary | ICD-10-CM | POA: Insufficient documentation

## 2023-10-20 DIAGNOSIS — T7840XA Allergy, unspecified, initial encounter: Secondary | ICD-10-CM

## 2023-10-20 DIAGNOSIS — R21 Rash and other nonspecific skin eruption: Secondary | ICD-10-CM | POA: Diagnosis not present

## 2023-10-20 DIAGNOSIS — L500 Allergic urticaria: Secondary | ICD-10-CM | POA: Diagnosis not present

## 2023-10-20 LAB — CBC WITH DIFFERENTIAL/PLATELET
Abs Immature Granulocytes: 0.03 10*3/uL (ref 0.00–0.07)
Basophils Absolute: 0.1 10*3/uL (ref 0.0–0.1)
Basophils Relative: 2 %
Eosinophils Absolute: 0.1 10*3/uL (ref 0.0–0.5)
Eosinophils Relative: 1 %
HCT: 34.1 % — ABNORMAL LOW (ref 36.0–46.0)
Hemoglobin: 11.1 g/dL — ABNORMAL LOW (ref 12.0–15.0)
Immature Granulocytes: 0 %
Lymphocytes Relative: 19 %
Lymphs Abs: 1.6 10*3/uL (ref 0.7–4.0)
MCH: 30.3 pg (ref 26.0–34.0)
MCHC: 32.6 g/dL (ref 30.0–36.0)
MCV: 93.2 fL (ref 80.0–100.0)
Monocytes Absolute: 0.3 10*3/uL (ref 0.1–1.0)
Monocytes Relative: 4 %
Neutro Abs: 6.1 10*3/uL (ref 1.7–7.7)
Neutrophils Relative %: 74 %
Platelets: 361 10*3/uL (ref 150–400)
RBC: 3.66 MIL/uL — ABNORMAL LOW (ref 3.87–5.11)
RDW: 13.1 % (ref 11.5–15.5)
WBC: 8.3 10*3/uL (ref 4.0–10.5)
nRBC: 0 % (ref 0.0–0.2)

## 2023-10-20 LAB — BASIC METABOLIC PANEL
Anion gap: 8 (ref 5–15)
BUN: 14 mg/dL (ref 6–20)
CO2: 25 mmol/L (ref 22–32)
Calcium: 8.8 mg/dL — ABNORMAL LOW (ref 8.9–10.3)
Chloride: 106 mmol/L (ref 98–111)
Creatinine, Ser: 0.66 mg/dL (ref 0.44–1.00)
GFR, Estimated: >60 mL/min (ref >60)
Glucose, Bld: 118 mg/dL — ABNORMAL HIGH (ref 70–99)
Potassium: 3.7 mmol/L (ref 3.5–5.1)
Sodium: 139 mmol/L (ref 135–145)

## 2023-10-20 MED ORDER — DIPHENHYDRAMINE HCL 50 MG/ML IJ SOLN
25.0000 mg | Freq: Once | INTRAMUSCULAR | Status: AC
  Administered 2023-10-20: 25 mg via INTRAVENOUS
  Filled 2023-10-20: qty 1

## 2023-10-20 MED ORDER — HYDROXYZINE HCL 25 MG PO TABS
25.0000 mg | ORAL_TABLET | Freq: Once | ORAL | Status: AC
  Administered 2023-10-20: 25 mg via ORAL
  Filled 2023-10-20: qty 1

## 2023-10-20 MED ORDER — HYDROXYZINE HCL 10 MG PO TABS: 10.0000 mg | ORAL_TABLET | Freq: Three times a day (TID) | ORAL | 2 refills | Status: AC | PRN

## 2023-10-20 MED ORDER — FAMOTIDINE IN NACL 20-0.9 MG/50ML-% IV SOLN
20.0000 mg | Freq: Once | INTRAVENOUS | Status: AC
  Administered 2023-10-20: 20 mg via INTRAVENOUS
  Filled 2023-10-20: qty 50

## 2023-10-20 MED ORDER — SODIUM CHLORIDE 0.9 % IV BOLUS
1000.0000 mL | Freq: Once | INTRAVENOUS | Status: AC
  Administered 2023-10-20: 1000 mL via INTRAVENOUS

## 2023-10-20 MED ORDER — DEXAMETHASONE SODIUM PHOSPHATE 10 MG/ML IJ SOLN
10.0000 mg | Freq: Once | INTRAMUSCULAR | Status: AC
  Administered 2023-10-20: 10 mg via INTRAMUSCULAR
  Filled 2023-10-20: qty 1

## 2023-10-20 NOTE — Discharge Instructions (Signed)
You were given decadron 10mg  iv, pepcid 20mg  iv, benadryl 25mg  iv, normal saline 1L iv, and hydroxyzine 25mg  po,  Follow-up with your regular doctor if not improving in 4 to 5 days.  Finish your steroid pack.  Return emergency department worsening

## 2023-10-20 NOTE — ED Notes (Signed)
See triage note  Presents with hives  States she noticed this after her lipo procedure

## 2023-10-20 NOTE — ED Triage Notes (Signed)
Pt arrives via POV. Pt reports since having lipo of abdomen on the 12th she has been having hives around the area she is wearing post operative "boards" and has been itching all over. She has been taking a medrol pack and benadryl with no relief.

## 2023-10-20 NOTE — ED Provider Notes (Signed)
[facility] Provider Note    Event Date/Time   First MD Initiated Contact with Patient 10/20/23 1027     (approximate)   History   Pruritis and Urticaria   HPI  Barbara [name] is a 57 y.o. female presents to the ED with complaint of urticaria for approximately 14 days.  Patient states that she had lipo surgery on her abdomen and [place] and the next day began breaking out and having itching.  Patient was prescribed methylprednisone and is finishing her pack today.  Patient denies any difficulty breathing and is unaware of any medication allergens.     Physical Exam   Triage Vital Signs: ED Triage Vitals  Encounter Vitals Group     BP 10/20/23 0950 123/79     Systolic BP Percentile --      Diastolic BP Percentile --      Pulse Rate 10/20/23 0950 75     Resp 10/20/23 0950 19     Temp 10/20/23 0950 98.3 F (36.8 C)     Temp src --      SpO2 10/20/23 0950 98 %     Weight 10/20/23 1015 138 lb 14.2 oz (63 kg)     Height 10/20/23 1015 5\' 5"  (1.651 m)     Head Circumference --      Peak Flow --      Pain Score 10/20/23 0953 8     Pain Loc --      Pain Education --      Exclude from Growth Chart --     Most recent vital signs: Vitals:   10/20/23 0950  BP: 123/79  Pulse: 75  Resp: 19  Temp: 98.3 F (36.8 C)  SpO2: 98%     General: Awake, no distress.  Patient appears to be very uncomfortable with moderate amount of itching and unable to sit due to scratching.  Urticarial rash noted. CV:  Good peripheral perfusion.  Heart regular rate and rhythm. Resp:  Normal effort.  Lungs are clear bilaterally. Abd:  No distention.  Other:     ED Results / Procedures / Treatments   Labs (all labs ordered are listed, but only abnormal results are displayed) Labs Reviewed - No data to display    PROCEDURES:  Critical Care performed:   Procedures   MEDICATIONS ORDERED IN ED: Medications  sodium chloride 0.9 % bolus 1,000 mL (has no  administration in time range)  dexamethasone (DECADRON) injection 10 mg (has no administration in time range)  diphenhydrAMINE (BENADRYL) injection 25 mg (25 mg Intravenous Given 10/20/23 1055)  famotidine (PEPCID) IVPB 20 mg premix (20 mg Intravenous New Bag/Given 10/20/23 1057)     IMPRESSION / MDM / ASSESSMENT AND PLAN / ED COURSE  I reviewed the triage vital signs and the nursing notes.   Differential diagnosis includes, but is not limited to, allergic reaction, pruritus, urticaria, contact dermatitis, medication allergy to possible iodine/ latex.  57 year old female presents to the ED with complaint of urticaria for approximately 2 weeks.  Patient has been taking Benadryl at home and also recently started a Medrol Dosepak in which she is on her last day.  No improvement of her itching.  Patient denies any respiratory difficulties.  Patient recently had liposuction done in [place] and has been communicating with the doctor in [place] up until now.  Patient was given IV Decadron, Benadryl, Pepcid while in the emergency department.  ----------------------------------------- 11:35 AM on 10/20/2023 ----------------------------------------- Remainder patient's care will  be by Greig Right, PA-C who is aware of what has been done for this patient and her history up until now.  Currently patient is stable with a blood pressure of 123/79, pulse 75, respirations 19 and O2 sat 98%.    Patient's presentation is most consistent with acute illness / injury with system symptoms.  FINAL CLINICAL IMPRESSION(S) / ED DIAGNOSES   Final diagnoses:  Urticaria     Rx / DC Orders   ED Discharge Orders     None        Note:  This document was prepared using Dragon voice recognition software and may include unintentional dictation errors.   Tommi Rumps, PA-C 10/20/23 1136    Jene Every, MD 10/20/23 1147

## 2023-10-20 NOTE — ED Provider Notes (Signed)
  Physical Exam  BP 123/79   Pulse 75   Temp 98.3 F (36.8 C)   Resp 19   Ht 5\' 5"  (1.651 m)   Wt 63 kg   SpO2 98%   BMI 23.11 kg/m   Physical Exam  Procedures  Procedures  ED Course / MDM    Medical Decision Making Amount and/or Complexity of Data Reviewed Labs: ordered.  Risk Prescription drug management.   Transfer of care from [place], [place].  Plan at this time is to reassess the patient with itching.  See if everything has improved.  Discussion with the patient, she states that the physician that did the lipo suction told her that 1 of 5 people gets this kind of reaction.  She has been on steroids and is still itching all over.  States this has been going on for about 16 days.  No fever or chills.  Patient was given Decadron 10 mg IV, Pepcid IV, fluids and Benadryl.  She is still itching and scratching.  Will give her a dose of Atarax to see if this improves.  Will also get basic labs to assess for anemia which could be causing some of the itching.  Patient had relief with the Atarax, states she is ready to go, CBC shows some decrease in her hemoglobin at 11.6.  Do not feel this is of note but she could also take iron to help with this for couple weeks.  Did explain all these findings to patient.  She is given prescription for Atarax, she is to follow-up with her doctor, return emergency department worsening.  In agreement with treatment plan.  Discharged stable condition care of her friend    Bartholomew Boards 10/20/23 1349    Jene Every, MD 10/20/23 1444
